# Patient Record
Sex: Female | Born: 1970 | Race: Black or African American | Hispanic: No | Smoking: Current some day smoker
Health system: Southern US, Community
[De-identification: ages and names within clinical notes are randomized; demographics above are authoritative.]

---

## 1999-02-23 ENCOUNTER — Encounter: Payer: Self-pay | Admitting: Emergency Medicine

## 1999-02-23 ENCOUNTER — Emergency Department (HOSPITAL_COMMUNITY): Admission: EM | Admit: 1999-02-23 | Discharge: 1999-02-23 | Payer: Self-pay | Admitting: Emergency Medicine

## 2001-07-30 ENCOUNTER — Emergency Department (HOSPITAL_COMMUNITY): Admission: EM | Admit: 2001-07-30 | Discharge: 2001-07-30 | Payer: Self-pay | Admitting: Emergency Medicine

## 2013-07-08 ENCOUNTER — Other Ambulatory Visit: Payer: Self-pay

## 2013-07-08 DIAGNOSIS — Z1231 Encounter for screening mammogram for malignant neoplasm of breast: Secondary | ICD-10-CM

## 2013-08-03 ENCOUNTER — Ambulatory Visit: Payer: Self-pay

## 2017-06-03 ENCOUNTER — Emergency Department (HOSPITAL_COMMUNITY): Payer: Self-pay

## 2017-06-03 ENCOUNTER — Emergency Department (HOSPITAL_COMMUNITY)
Admission: EM | Admit: 2017-06-03 | Discharge: 2017-06-03 | Disposition: A | Discharge status: Home / Self Care | Payer: Self-pay | Attending: Emergency Medicine | Admitting: Emergency Medicine

## 2017-06-03 ENCOUNTER — Encounter (HOSPITAL_COMMUNITY): Payer: Self-pay

## 2017-06-03 DIAGNOSIS — Z720 Tobacco use: Secondary | ICD-10-CM

## 2017-06-03 DIAGNOSIS — J209 Acute bronchitis, unspecified: Secondary | ICD-10-CM | POA: Insufficient documentation

## 2017-06-03 DIAGNOSIS — F17200 Nicotine dependence, unspecified, uncomplicated: Secondary | ICD-10-CM | POA: Insufficient documentation

## 2017-06-03 MED ORDER — AZITHROMYCIN 250 MG PO TABS
500.0000 mg | ORAL_TABLET | Freq: Once | ORAL | Status: AC
  Administered 2017-06-03: 500 mg via ORAL
  Filled 2017-06-03: qty 2

## 2017-06-03 MED ORDER — AEROCHAMBER PLUS FLO-VU LARGE MISC
Status: AC
  Filled 2017-06-03: qty 1

## 2017-06-03 MED ORDER — AZITHROMYCIN 250 MG PO TABS: ORAL_TABLET | ORAL | 0 refills | Status: AC

## 2017-06-03 MED ORDER — ALBUTEROL SULFATE HFA 108 (90 BASE) MCG/ACT IN AERS
1.0000 | INHALATION_SPRAY | RESPIRATORY_TRACT | Status: DC | PRN
  Administered 2017-06-03: 1 via RESPIRATORY_TRACT
  Filled 2017-06-03: qty 6.7

## 2017-06-03 MED ORDER — AEROCHAMBER Z-STAT PLUS/MEDIUM MISC
1.0000 | Freq: Once | Status: AC
  Administered 2017-06-03: 1

## 2017-06-03 NOTE — ED Notes (Signed)
Pt transported to xray 

## 2017-06-03 NOTE — Discharge Instructions (Signed)
Try to stop smoking. °

## 2017-06-03 NOTE — ED Provider Notes (Signed)
[facility]-EMERGENCY DEPT Provider Note   CSN: [id number] Arrival date & time: 06/03/17  0713     History   Chief Complaint Chief Complaint  Patient presents with  . cough, congestion    HPI Ellen Rhodes is a 46 y.o. female.  Pt presents to the ED today with 2 weeks of cough and congestion.  She has taken otc mucinex and sx have not improved.  She has had f/c.  She denies any n/v.  She does smoke.  No hx of lung problems.      History reviewed. No pertinent past medical history.  There are no active problems to display for this patient.   History reviewed. No pertinent surgical history.  OB History    No data available       Home Medications    Prior to Admission medications   Medication Sig Start Date End Date Taking? Authorizing Provider  azithromycin (ZITHROMAX) 250 MG tablet Take 1 every day until finished. 06/04/17   Jacalyn Lefevre, MD    Family History No family history on file.  Social History Social History  Substance Use Topics  . Smoking status: Current Some Day Smoker  . Smokeless tobacco: Never Used  . Alcohol use Not on file     Allergies   Patient has no known allergies.   Review of Systems Review of Systems  Respiratory: Positive for cough.   All other systems reviewed and are negative.    Physical Exam Updated Vital Signs BP (!) 140/93 (BP Location: Left Arm)   Pulse (!) 118   Temp 98.4 F (36.9 C) (Oral)   Resp 18   SpO2 98%   Physical Exam  Constitutional: She is oriented to person, place, and time. She appears well-developed and well-nourished.  HENT:  Head: Normocephalic and atraumatic.  Right Ear: External ear normal.  Left Ear: External ear normal.  Nose: Nose normal.  Mouth/Throat: Oropharynx is clear and moist.  Eyes: Pupils are equal, round, and reactive to light. Conjunctivae and EOM are normal.  Neck: Normal range of motion. Neck supple.  Cardiovascular: Normal rate, regular rhythm, normal heart sounds and  intact distal pulses.   Pulmonary/Chest: Effort normal. She has wheezes.  Abdominal: Soft. Bowel sounds are normal.  Musculoskeletal: Normal range of motion.  Neurological: She is alert and oriented to person, place, and time.  Skin: Skin is warm.  Psychiatric: She has a normal mood and affect. Her behavior is normal. Thought content normal.  Nursing note and vitals reviewed.    ED Treatments / Results  Labs (all labs ordered are listed, but only abnormal results are displayed) Labs Reviewed - No data to display  EKG  EKG Interpretation None       Radiology Dg Chest 2 View  Result Date: 06/03/2017 CLINICAL DATA:  Cough congestion chills EXAM: CHEST  2 VIEW COMPARISON:  None. FINDINGS: The heart size and mediastinal contours are within normal limits. Both lungs are clear. The visualized skeletal structures are unremarkable. IMPRESSION: No active cardiopulmonary disease. Electronically Signed   By: Marlan Palau M.D.   On: 06/03/2017 07:42    Procedures Procedures (including critical care time)  Medications Ordered in ED Medications  albuterol (PROVENTIL HFA;VENTOLIN HFA) 108 (90 Base) MCG/ACT inhaler 1-2 puff (1 puff Inhalation Given 06/03/17 0908)  AEROCHAMBER PLUS FLO-VU LARGE MISC (not administered)  azithromycin (ZITHROMAX) tablet 500 mg (500 mg Oral Given 06/03/17 0908)  aerochamber Z-Stat Plus/medium 1 each (1 each Other Given 06/03/17 0909)  Initial Impression / Assessment and Plan / ED Course  I have reviewed the triage vital signs and the nursing notes.  Pertinent labs & imaging results that were available during my care of the patient were reviewed by me and considered in my medical decision making (see chart for details).     Sx have been going on for 2 weeks, so I will treat with abx.  Pt also advised to stop smoking.  She knows to return if worse.  She is given the number for [facility] to establish pcp.  Final Clinical Impressions(s) / ED  Diagnoses   Final diagnoses:  Acute bronchitis, unspecified organism  Tobacco abuse    New Prescriptions New Prescriptions   AZITHROMYCIN (ZITHROMAX) 250 MG TABLET    Take 1 every day until finished.     Jacalyn LefevreHaviland, Roneshia Drew, MD 06/03/17 [phone]

## 2017-06-03 NOTE — ED Triage Notes (Signed)
Patient complains of 3 days of cough, congestion, chills and diaphoresis with body aches. Has taken otc cough meds. Alert and oriented, chest wall pain and headache with cough

## 2017-09-01 ENCOUNTER — Emergency Department (HOSPITAL_COMMUNITY): Payer: Self-pay

## 2017-09-01 ENCOUNTER — Encounter (HOSPITAL_COMMUNITY): Payer: Self-pay | Admitting: Emergency Medicine

## 2017-09-01 ENCOUNTER — Emergency Department (HOSPITAL_COMMUNITY)
Admission: EM | Admit: 2017-09-01 | Discharge: 2017-09-01 | Disposition: A | Discharge status: Home / Self Care | Payer: Self-pay | Attending: Emergency Medicine | Admitting: Emergency Medicine

## 2017-09-01 ENCOUNTER — Other Ambulatory Visit: Payer: Self-pay

## 2017-09-01 DIAGNOSIS — J069 Acute upper respiratory infection, unspecified: Secondary | ICD-10-CM | POA: Insufficient documentation

## 2017-09-01 DIAGNOSIS — F17210 Nicotine dependence, cigarettes, uncomplicated: Secondary | ICD-10-CM | POA: Insufficient documentation

## 2017-09-01 DIAGNOSIS — B9789 Other viral agents as the cause of diseases classified elsewhere: Secondary | ICD-10-CM

## 2017-09-01 DIAGNOSIS — R05 Cough: Secondary | ICD-10-CM

## 2017-09-01 DIAGNOSIS — R059 Cough, unspecified: Secondary | ICD-10-CM

## 2017-09-01 MED ORDER — DEXAMETHASONE SODIUM PHOSPHATE 10 MG/ML IJ SOLN
10.0000 mg | Freq: Once | INTRAMUSCULAR | Status: AC
  Administered 2017-09-01: 10 mg via INTRAMUSCULAR
  Filled 2017-09-01: qty 1

## 2017-09-01 MED ORDER — ALBUTEROL SULFATE HFA 108 (90 BASE) MCG/ACT IN AERS
2.0000 | INHALATION_SPRAY | Freq: Once | RESPIRATORY_TRACT | Status: AC
  Administered 2017-09-01: 2 via RESPIRATORY_TRACT
  Filled 2017-09-01: qty 6.7

## 2017-09-01 MED ORDER — IBUPROFEN 200 MG PO TABS
600.0000 mg | ORAL_TABLET | Freq: Once | ORAL | Status: AC
  Administered 2017-09-01: 12:00:00 600 mg via ORAL
  Filled 2017-09-01: qty 1

## 2017-09-01 NOTE — ED Triage Notes (Signed)
cold and chills and cough x `1 week states was sent home from work feels she has cold in her chest

## 2017-09-01 NOTE — ED Notes (Signed)
Pt stable, ambulatory, states understanding of discharge instructions 

## 2017-09-01 NOTE — ED Provider Notes (Signed)
[facility] EMERGENCY DEPARTMENT Provider Note   CSN: [id number] Arrival date & time: 09/01/17  1027     History   Chief Complaint Chief Complaint  Patient presents with  . Cough  . Influenza    HPI  Ellen Rhodes is a 46 y.o. Female with a history of tobacco use, presents complaining of 1 week of nasal congestion, cough and chills.  She reports symptoms started last Tuesday, when she started to get a lot of nasal congestion and drainage.  Since then she has had persistent drainage with a cough occasionally productive of mucus.  She reports some chills but denies measured fever at home.  Patient reports her throat feels a bit dry and scratchy, but is not necessarily sore.  Denies any ear pain, no chest pain or shortness of breath, no nausea, vomiting or abdominal pain.  Patient reports she has been trying to treat symptoms at home with Mucinex, TheraFlu and NyQuil, symptoms are worsening but she seen no improvement.  Patient tried to go into work today but her work sentt her home.       History reviewed. No pertinent past medical history.  There are no active problems to display for this patient.   History reviewed. No pertinent surgical history.  OB History    No data available       Home Medications    Prior to Admission medications   Medication Sig Start Date End Date Taking? Authorizing Provider  azithromycin (ZITHROMAX) 250 MG tablet Take 1 every day until finished. 06/04/17   Jacalyn Lefevre, MD    Family History No family history on file.  Social History Social History   Tobacco Use  . Smoking status: Current Some Day Smoker  . Smokeless tobacco: Never Used  Substance Use Topics  . Alcohol use: No    Frequency: Never  . Drug use: No     Allergies   Patient has no known allergies.   Review of Systems Review of Systems  Constitutional: Positive for chills. Negative for fatigue.  HENT: Positive for congestion, postnasal  drip, rhinorrhea and sore throat. Negative for ear discharge, ear pain, sinus pressure, trouble swallowing and voice change.   Eyes: Negative for discharge.  Respiratory: Positive for cough. Negative for chest tightness and shortness of breath.   Cardiovascular: Negative for chest pain.  Gastrointestinal: Negative for abdominal pain, diarrhea and vomiting.  Musculoskeletal: Positive for myalgias.  Skin: Negative for rash.  Neurological: Negative for headaches.     Physical Exam Updated Vital Signs BP 126/89   Pulse 99   Temp 98.2 F (36.8 C) (Oral)   Resp 20   SpO2 99%   Physical Exam  Constitutional: She appears well-developed and well-nourished. No distress.  HENT:  Head: Normocephalic and atraumatic.  TMs clear with good landmarks, moderate nasal mucosa edema with clear rhinorrhea, posterior oropharynx clear and moist, with some erythema, no edema or exudates. Uvula midline, no trismus, no evidence of PTA  Eyes: Right eye exhibits no discharge. Left eye exhibits no discharge.  Neck: Normal range of motion. Neck supple.  Cardiovascular: Normal rate, regular rhythm, normal heart sounds and intact distal pulses.  Pulmonary/Chest: Effort normal and breath sounds normal. No stridor. No respiratory distress. She has no wheezes. She has no rales. She exhibits no tenderness.  Abdominal: Soft. Bowel sounds are normal. She exhibits no distension. There is no tenderness. There is no guarding.  Lymphadenopathy:    She has no cervical adenopathy.  Neurological: She is alert. Coordination normal.  Skin: Skin is warm and dry. Capillary refill takes less than 2 seconds. She is not diaphoretic.  Psychiatric: She has a normal mood and affect. Her behavior is normal.  Nursing note and vitals reviewed.    ED Treatments / Results  Labs (all labs ordered are listed, but only abnormal results are displayed) Labs Reviewed - No data to display  EKG  EKG Interpretation None        Radiology Dg Chest 2 View  Result Date: 09/01/2017 CLINICAL DATA:  Sinus drainage and body aches for almost a week. EXAM: CHEST  2 VIEW COMPARISON:  PA and lateral chest 06/03/2017. FINDINGS: Lungs are clear. Heart size is normal. No pneumothorax or pleural effusion. No bony abnormality. IMPRESSION: Negative chest. Electronically Signed   By: Drusilla Kannerhomas  Dalessio M.D.   On: 09/01/2017 13:15    Procedures Procedures (including critical care time)  Medications Ordered in ED Medications  ibuprofen (ADVIL,MOTRIN) tablet 600 mg (600 mg Oral Given 09/01/17 1219)  dexamethasone (DECADRON) injection 10 mg (10 mg Intramuscular Given 09/01/17 1349)  albuterol (PROVENTIL HFA;VENTOLIN HFA) 108 (90 Base) MCG/ACT inhaler 2 puff (2 puffs Inhalation Given 09/01/17 1349)     Initial Impression / Assessment and Plan / ED Course  I have reviewed the triage vital signs and the nursing notes.  Pertinent labs & imaging results that were available during my care of the patient were reviewed by me and considered in my medical decision making (see chart for details).  Pt CXR negative for acute infiltrate. Patients symptoms are consistent with URI, likely viral etiology. Discussed that antibiotics are not indicated for viral infections. Given symptoms have been presents for 8 days with no improvement will give steroid and try albuterol inhaler for cough. Pt will be discharged with symptomatic treatment.  Verbalizes understanding and is agreeable with plan. Pt is hemodynamically stable & in NAD prior to dc.   Final Clinical Impressions(s) / ED Diagnoses   Final diagnoses:  Cough  Viral URI with cough    ED Discharge Orders    None       Legrand RamsFord, Clydell Sposito N, PA-C 09/01/17 2119    Raeford RazorKohut, Stephen, MD 09/09/17 1130

## 2017-09-01 NOTE — ED Notes (Signed)
Patient returned from xray.

## 2017-09-01 NOTE — Discharge Instructions (Signed)
Your symptoms are likely due to a viral upper respiratory infection, your chest x-ray is clear.  Treat symptoms supportively with Sudafed and Flonase for nasal decongestion.  You may also use albuterol inhaler as needed for cough, as well as over-the-counter cough syrups.  Ibuprofen or Tylenol for pain.  If symptoms are not improving please follow-up with the [facility] in 1 week.  If you develop shortness of breath, chest pain, persistent fevers or other concerning symptoms please follow-up sooner at the emergency department.

## 2021-09-25 ENCOUNTER — Encounter (HOSPITAL_COMMUNITY): Payer: Self-pay

## 2021-09-25 ENCOUNTER — Other Ambulatory Visit: Payer: Self-pay

## 2021-09-25 ENCOUNTER — Emergency Department (HOSPITAL_COMMUNITY): Payer: Self-pay

## 2021-09-25 ENCOUNTER — Emergency Department (HOSPITAL_COMMUNITY)
Admission: EM | Admit: 2021-09-25 | Discharge: 2021-09-25 | Disposition: A | Discharge status: Home / Self Care | Payer: Self-pay | Attending: Emergency Medicine | Admitting: Emergency Medicine

## 2021-09-25 DIAGNOSIS — Z5321 Procedure and treatment not carried out due to patient leaving prior to being seen by health care provider: Secondary | ICD-10-CM | POA: Insufficient documentation

## 2021-09-25 DIAGNOSIS — R42 Dizziness and giddiness: Secondary | ICD-10-CM | POA: Insufficient documentation

## 2021-09-25 DIAGNOSIS — R079 Chest pain, unspecified: Secondary | ICD-10-CM | POA: Insufficient documentation

## 2021-09-25 DIAGNOSIS — R110 Nausea: Secondary | ICD-10-CM | POA: Insufficient documentation

## 2021-09-25 LAB — CBC
HCT: 43 % (ref 36.0–46.0)
Hemoglobin: 13.9 g/dL (ref 12.0–15.0)
MCH: 26.4 pg (ref 26.0–34.0)
MCHC: 32.3 g/dL (ref 30.0–36.0)
MCV: 81.7 fL (ref 80.0–100.0)
Platelets: 412 10*3/uL — ABNORMAL HIGH (ref 150–400)
RBC: 5.26 MIL/uL — ABNORMAL HIGH (ref 3.87–5.11)
RDW: 14.9 % (ref 11.5–15.5)
WBC: 10.6 10*3/uL — ABNORMAL HIGH (ref 4.0–10.5)
nRBC: 0 % (ref 0.0–0.2)

## 2021-09-25 LAB — BASIC METABOLIC PANEL
Anion gap: 9 (ref 5–15)
BUN: 10 mg/dL (ref 6–20)
CO2: 23 mmol/L (ref 22–32)
Calcium: 8.9 mg/dL (ref 8.9–10.3)
Chloride: 106 mmol/L (ref 98–111)
Creatinine, Ser: 0.6 mg/dL (ref 0.44–1.00)
GFR, Estimated: >60 mL/min (ref >60)
Glucose, Bld: 85 mg/dL (ref 70–99)
Potassium: 4.1 mmol/L (ref 3.5–5.1)
Sodium: 138 mmol/L (ref 135–145)

## 2021-09-25 LAB — TROPONIN I (HIGH SENSITIVITY): Troponin I (High Sensitivity): 2 ng/L (ref <18)

## 2021-09-25 NOTE — ED Triage Notes (Signed)
Patient reports that she breathed in some carbon monoxide at work. X 30 minutes and since breathing the carbon monoxide  she reports nausea and chest pain x 3 days

## 2021-09-25 NOTE — ED Notes (Signed)
Pt says she's leaving.

## 2021-09-25 NOTE — ED Provider Notes (Signed)
Emergency Medicine Provider Triage Evaluation Note  Ellen Rhodes , a 50 y.o. female  was evaluated in triage.  Pt complains of nausea, dizziness, chest pain.  Started on Friday after her furnace was placed at her work and she had 30 minutes of carbon oxide exposure..  Review of Systems  Positive: Chest pain Negative: syncope  Physical Exam  BP (!) 136/94 (BP Location: Left Arm)   Pulse 94   Temp 98 F (36.7 C) (Oral)   Resp 18   Ht 5\' 1"  (1.549 m)   Wt 79.8 kg   SpO2 100%   BMI 33.25 kg/m  Gen:   Awake, no distress   Resp:  Normal effort  MSK:   Moves extremities without difficulty  Other:  Elevated heart rate but not tachycardic, no tachypnea.  Medical Decision Making  Medically screening exam initiated at 1:32 PM.  Appropriate orders placed.  Ellen Rhodes was informed that the remainder of the evaluation will be completed by another provider, this initial triage assessment does not replace that evaluation, and the importance of remaining in the ED until their evaluation is complete.  Doubt monoxide poisoning given his been more than 3 days on room air.  Chest pain work-up   Ellen Rhodes, Ellen Rhodes 09/25/21 1333    09/27/21, MD 09/30/21 1459

## 2021-09-26 ENCOUNTER — Other Ambulatory Visit: Payer: Self-pay

## 2021-09-26 ENCOUNTER — Encounter (HOSPITAL_COMMUNITY): Payer: Self-pay

## 2021-09-26 ENCOUNTER — Emergency Department (HOSPITAL_COMMUNITY)
Admission: EM | Admit: 2021-09-26 | Discharge: 2021-09-26 | Discharge status: Against Medical Advice | Payer: Medicaid Other | Attending: Emergency Medicine | Admitting: Emergency Medicine

## 2021-09-26 DIAGNOSIS — R0789 Other chest pain: Secondary | ICD-10-CM | POA: Insufficient documentation

## 2021-09-26 DIAGNOSIS — R0602 Shortness of breath: Secondary | ICD-10-CM | POA: Insufficient documentation

## 2021-09-26 DIAGNOSIS — Z20822 Contact with and (suspected) exposure to covid-19: Secondary | ICD-10-CM | POA: Insufficient documentation

## 2021-09-26 DIAGNOSIS — F17210 Nicotine dependence, cigarettes, uncomplicated: Secondary | ICD-10-CM | POA: Insufficient documentation

## 2021-09-26 LAB — RESP PANEL BY RT-PCR (FLU A&B, COVID) ARPGX2
Influenza A by PCR: NEGATIVE
Influenza B by PCR: NEGATIVE
SARS Coronavirus 2 by RT PCR: NEGATIVE

## 2021-09-26 MED ORDER — IBUPROFEN 800 MG PO TABS
800.0000 mg | ORAL_TABLET | Freq: Once | ORAL | Status: AC
  Administered 2021-09-26: 800 mg via ORAL
  Filled 2021-09-26: qty 1

## 2021-09-26 NOTE — ED Provider Notes (Signed)
[facility]   CSN: [id number] Arrival date & time: 09/26/21  3785     History Chief Complaint  Patient presents with   Chest Pain   Shortness of Breath    Ellen Rhodes is a 50 y.o. female.  Patient complains of some chest pain and shortness of breath.  She was seen in ED yesterday briefly but left AMA.  Patient having some chest discomfort now labs yesterday including troponins were negative.  EKG and chest x-ray are unremarkable  The history is provided by the patient and medical records. No language interpreter was used.  Chest Pain Pain location:  Unable to specify Pain quality: aching   Pain radiates to:  Does not radiate Pain severity:  Mild Onset quality:  Gradual Timing:  Constant Progression:  Waxing and waning Chronicity:  New Associated symptoms: shortness of breath   Associated symptoms: no abdominal pain, no back pain, no cough, no fatigue and no headache   Shortness of Breath Associated symptoms: chest pain   Associated symptoms: no abdominal pain, no cough, no headaches and no rash       History reviewed. No pertinent past medical history.  There are no problems to display for this patient.   History reviewed. No pertinent surgical history.   OB History   No obstetric history on file.     Family History  Problem Relation Age of Onset   Stroke Mother    Cerebral aneurysm Mother     Social History   Tobacco Use   Smoking status: Some Days    Types: Cigarettes   Smokeless tobacco: Never  Vaping Use   Vaping Use: Never used  Substance Use Topics   Alcohol use: No   Drug use: No    Home Medications Prior to Admission medications   Medication Sig Start Date End Date Taking? Authorizing Provider  azithromycin (ZITHROMAX) 250 MG tablet Take 1 every day until finished. 06/04/17   Jacalyn Lefevre, MD    Allergies    Patient has no known allergies.  Review of Systems   Review of  Systems  Constitutional:  Negative for appetite change and fatigue.  HENT:  Negative for congestion, ear discharge and sinus pressure.   Eyes:  Negative for discharge.  Respiratory:  Positive for shortness of breath. Negative for cough.   Cardiovascular:  Positive for chest pain.  Gastrointestinal:  Negative for abdominal pain and diarrhea.  Genitourinary:  Negative for frequency and hematuria.  Musculoskeletal:  Negative for back pain.  Skin:  Negative for rash.  Neurological:  Negative for seizures and headaches.  Psychiatric/Behavioral:  Negative for hallucinations.    Physical Exam Updated Vital Signs BP (!) 130/115   Pulse 72   Temp 98.7 F (37.1 C) (Oral)   Resp 20   Ht 5\' 1"  (1.549 m)   Wt 79.8 kg   SpO2 100%   BMI 33.25 kg/m   Physical Exam Vitals and nursing note reviewed.  Constitutional:      Appearance: She is well-developed.  HENT:     Head: Normocephalic.     Nose: Nose normal.  Eyes:     General: No scleral icterus.    Conjunctiva/sclera: Conjunctivae normal.  Neck:     Thyroid: No thyromegaly.  Cardiovascular:     Rate and Rhythm: Normal rate and regular rhythm.     Heart sounds: No murmur heard.   No friction rub. No gallop.  Pulmonary:     Breath  sounds: No stridor. No wheezing or rales.  Chest:     Chest wall: No tenderness.  Abdominal:     General: There is no distension.     Tenderness: There is no abdominal tenderness. There is no rebound.  Musculoskeletal:        General: Normal range of motion.     Cervical back: Neck supple.  Lymphadenopathy:     Cervical: No cervical adenopathy.  Skin:    Findings: No erythema or rash.  Neurological:     Mental Status: She is alert and oriented to person, place, and time.     Motor: No abnormal muscle tone.     Coordination: Coordination normal.  Psychiatric:        Behavior: Behavior normal.    ED Results / Procedures / Treatments   Labs (all labs ordered are listed, but only abnormal  results are displayed) Labs Reviewed  RESP PANEL BY RT-PCR (FLU A&B, COVID) [id number]    EKG None  Radiology DG Chest 2 View  Result Date: 09/25/2021 CLINICAL DATA:  Chest pain EXAM: CHEST - 2 VIEW COMPARISON:  November 2018 FINDINGS: The heart size and mediastinal contours are within normal limits. Both lungs are clear. No pleural effusion or pneumothorax. The visualized skeletal structures are unremarkable. IMPRESSION: No acute process in the chest. Electronically Signed   By: Macy Mis M.D.   On: 09/25/2021 14:26    Procedures Procedures   Medications Ordered in ED Medications  ibuprofen (ADVIL) tablet 800 mg (800 mg Oral Given 09/26/21 0935)    ED Course  I have reviewed the triage vital signs and the nursing notes.  Pertinent labs & imaging results that were available during my care of the patient were reviewed by me and considered in my medical decision making (see chart for details).    MDM Rules/Calculators/A&P                           Patient with atypical chest discomfort and had normal labs and EKG.  Patient left AMA before we will discuss the treatment plan Final Clinical Impression(s) / ED Diagnoses Final diagnoses:  Atypical chest pain    Rx / DC Orders ED Discharge Orders     None        Milton Ferguson, MD 09/26/21 1227

## 2021-09-26 NOTE — ED Triage Notes (Signed)
Pt reports being seen here for same yesterday, but left prior to being seen by a provider and getting results. She reports her nausea has subsided, but occasionally still has mild chest pain and shortness of breath.

## 2021-09-27 ENCOUNTER — Other Ambulatory Visit: Payer: Self-pay

## 2021-09-27 ENCOUNTER — Emergency Department (HOSPITAL_COMMUNITY): Payer: Medicaid Other

## 2021-09-27 ENCOUNTER — Encounter (HOSPITAL_COMMUNITY): Payer: Self-pay | Admitting: Emergency Medicine

## 2021-09-27 ENCOUNTER — Emergency Department (HOSPITAL_COMMUNITY)
Admission: EM | Admit: 2021-09-27 | Discharge: 2021-09-27 | Disposition: A | Discharge status: Home / Self Care | Payer: Medicaid Other | Attending: Emergency Medicine | Admitting: Emergency Medicine

## 2021-09-27 DIAGNOSIS — R42 Dizziness and giddiness: Secondary | ICD-10-CM | POA: Insufficient documentation

## 2021-09-27 DIAGNOSIS — F17210 Nicotine dependence, cigarettes, uncomplicated: Secondary | ICD-10-CM | POA: Insufficient documentation

## 2021-09-27 DIAGNOSIS — J3489 Other specified disorders of nose and nasal sinuses: Secondary | ICD-10-CM | POA: Insufficient documentation

## 2021-09-27 DIAGNOSIS — J069 Acute upper respiratory infection, unspecified: Secondary | ICD-10-CM | POA: Insufficient documentation

## 2021-09-27 LAB — COMPREHENSIVE METABOLIC PANEL
ALT: 15 U/L (ref 0–44)
AST: 15 U/L (ref 15–41)
Albumin: 3.3 g/dL — ABNORMAL LOW (ref 3.5–5.0)
Alkaline Phosphatase: 77 U/L (ref 38–126)
Anion gap: 7 (ref 5–15)
BUN: 10 mg/dL (ref 6–20)
CO2: 27 mmol/L (ref 22–32)
Calcium: 8.9 mg/dL (ref 8.9–10.3)
Chloride: 105 mmol/L (ref 98–111)
Creatinine, Ser: 0.79 mg/dL (ref 0.44–1.00)
GFR, Estimated: >60 mL/min (ref >60)
Glucose, Bld: 95 mg/dL (ref 70–99)
Potassium: 3.9 mmol/L (ref 3.5–5.1)
Sodium: 139 mmol/L (ref 135–145)
Total Bilirubin: 0.5 mg/dL (ref 0.3–1.2)
Total Protein: 6.7 g/dL (ref 6.5–8.1)

## 2021-09-27 LAB — CBC WITH DIFFERENTIAL/PLATELET
Abs Immature Granulocytes: 0.07 10*3/uL (ref 0.00–0.07)
Basophils Absolute: 0 10*3/uL (ref 0.0–0.1)
Basophils Relative: 0 %
Eosinophils Absolute: 0.2 10*3/uL (ref 0.0–0.5)
Eosinophils Relative: 4 %
HCT: 41.4 % (ref 36.0–46.0)
Hemoglobin: 13 g/dL (ref 12.0–15.0)
Immature Granulocytes: 1 %
Lymphocytes Relative: 38 %
Lymphs Abs: 2.6 10*3/uL (ref 0.7–4.0)
MCH: 26 pg (ref 26.0–34.0)
MCHC: 31.4 g/dL (ref 30.0–36.0)
MCV: 82.8 fL (ref 80.0–100.0)
Monocytes Absolute: 0.5 10*3/uL (ref 0.1–1.0)
Monocytes Relative: 7 %
Neutro Abs: 3.5 10*3/uL (ref 1.7–7.7)
Neutrophils Relative %: 50 %
Platelets: 370 10*3/uL (ref 150–400)
RBC: 5 MIL/uL (ref 3.87–5.11)
RDW: 14.9 % (ref 11.5–15.5)
WBC: 6.9 10*3/uL (ref 4.0–10.5)
nRBC: 0 % (ref 0.0–0.2)

## 2021-09-27 LAB — URINALYSIS, ROUTINE W REFLEX MICROSCOPIC
Bilirubin Urine: NEGATIVE
Glucose, UA: NEGATIVE mg/dL
Hgb urine dipstick: NEGATIVE
Ketones, ur: NEGATIVE mg/dL
Leukocytes,Ua: NEGATIVE
Nitrite: NEGATIVE
Protein, ur: NEGATIVE mg/dL
Specific Gravity, Urine: >1.03 — ABNORMAL HIGH (ref 1.005–1.030)
pH: 5.5 (ref 5.0–8.0)

## 2021-09-27 MED ORDER — ALBUTEROL SULFATE HFA 108 (90 BASE) MCG/ACT IN AERS
2.0000 | INHALATION_SPRAY | Freq: Once | RESPIRATORY_TRACT | Status: AC
  Administered 2021-09-27: 2 via RESPIRATORY_TRACT
  Filled 2021-09-27: qty 6.7

## 2021-09-27 MED ORDER — DEXAMETHASONE 4 MG PO TABS
10.0000 mg | ORAL_TABLET | Freq: Once | ORAL | Status: AC
  Administered 2021-09-27: 10 mg via ORAL
  Filled 2021-09-27: qty 3

## 2021-09-27 NOTE — ED Provider Notes (Signed)
Emergency Medicine Provider Triage Evaluation Note  Ellen Rhodes , a 50 y.o. female  was evaluated in triage.  Pt complains of dizziness. Seen in ED two days ago, left after MSE.  Seen yesterday again, left prior to plan discussion per previous provider note.  Has been having headache over the last few days.  No known traumatic injury.  No congestion, rhinorrhea.  Had chest pain yesterday which resolved.  No shortness of breath, lower extremity swelling.  No paresthesias, weakness.  She does feel dizzy which she cannot describe.  Has been ambulatory without difficulty.  Review of Systems  Positive: Headache, dizziness, chest pain Negative: Numbness, unilateral weakness  Physical Exam  BP 123/69 (BP Location: Right Arm)   Pulse 95   Temp 99 F (37.2 C) (Oral)   Resp 14   SpO2 100%  Gen:   Awake, no distress   Resp:  Normal effort  MSK:   Moves extremities without difficulty  Neuro:  Cn 2-12 grossly intact, intact sensation, equal strength Other:    Medical Decision Making  Medically screening exam initiated at 7:54 AM.  Appropriate orders placed.  Marvelous A Wisler was informed that the remainder of the evaluation will be completed by another provider, this initial triage assessment does not replace that evaluation, and the importance of remaining in the ED until their evaluation is complete.  Headache, dizziness, chest pain.  Third visit within 2 days for similar complaints, will obtain head imaging, labs   Kashif Pooler A, PA-C 09/27/21 0806    Terald Sleeper, MD 09/27/21 [phone]

## 2021-09-27 NOTE — Discharge Instructions (Signed)
Take tylenol 2 pills 4 times a day and motrin 4 pills 3 times a day.  Drink plenty of fluids.  Return for worsening shortness of breath, headache, confusion. Follow up with your family doctor.   

## 2021-09-27 NOTE — ED Notes (Signed)
Pt verbalized understanding of d/c instructions, meds, and followup care. Denies questions. VSS, no distress noted. Steady gait to exit with all belongings.  ?

## 2021-09-27 NOTE — ED Provider Notes (Signed)
[facility] EMERGENCY DEPARTMENT Provider Note   CSN: [id number] Arrival date & time: 09/27/21  [id number]     History Chief Complaint  Patient presents with   Dizziness    Ellen Rhodes is a 50 y.o. female.  50 yo F with chief complaints of cough congestion shortness of breath dizziness going on for a couple days.  The patient was at work and they had installed a new fireplace and had tried to use it and slid off the smell that she found to be somewhat irritating.  She was fine while she was at work but when she got home that evening started having some difficulty breathing.  Had been to the ED now 3 times since but had not been able to wait to be seen.  She feels like she has gotten progressively better.  Has had some dizziness and nausea with this.  A little bit of cough and congestion.  Had some chest discomfort as well that seems to be off and on.  The history is provided by the patient.  Dizziness Associated symptoms: headaches   Associated symptoms: no chest pain, no nausea, no palpitations, no shortness of breath and no vomiting   Illness Severity:  Moderate Onset quality:  Gradual Duration:  3 hours Timing:  Constant Progression:  Worsening Chronicity:  New Associated symptoms: congestion, cough, headaches and rhinorrhea   Associated symptoms: no chest pain, no fever, no myalgias, no nausea, no shortness of breath, no vomiting and no wheezing       History reviewed. No pertinent past medical history.  There are no problems to display for this patient.   History reviewed. No pertinent surgical history.   OB History   No obstetric history on file.     Family History  Problem Relation Age of Onset   Stroke Mother    Cerebral aneurysm Mother     Social History   Tobacco Use   Smoking status: Some Days    Types: Cigarettes   Smokeless tobacco: Never  Vaping Use   Vaping Use: Never used  Substance Use Topics   Alcohol use: No   Drug  use: No    Home Medications Prior to Admission medications   Medication Sig Start Date End Date Taking? Authorizing Provider  azithromycin (ZITHROMAX) 250 MG tablet Take 1 every day until finished. 06/04/17   Isla Pence, MD    Allergies    Patient has no known allergies.  Review of Systems   Review of Systems  Constitutional:  Negative for chills and fever.  HENT:  Positive for congestion, postnasal drip and rhinorrhea.   Eyes:  Negative for redness and visual disturbance.  Respiratory:  Positive for cough. Negative for shortness of breath and wheezing.   Cardiovascular:  Negative for chest pain and palpitations.  Gastrointestinal:  Negative for nausea and vomiting.  Genitourinary:  Negative for dysuria and urgency.  Musculoskeletal:  Negative for arthralgias and myalgias.  Skin:  Negative for pallor and wound.  Neurological:  Positive for dizziness and headaches.   Physical Exam Updated Vital Signs BP 113/83   Pulse 78   Temp 97.8 F (36.6 C) (Oral)   Resp 16   SpO2 98%   Physical Exam Vitals and nursing note reviewed.  Constitutional:      General: She is not in acute distress.    Appearance: She is well-developed. She is not diaphoretic.  HENT:     Head: Normocephalic and atraumatic.  Comments: Swollen turbinates, posterior nasal drip, no noted sinus ttp, tm normal bilaterally.   Eyes:     Pupils: Pupils are equal, round, and reactive to light.  Cardiovascular:     Rate and Rhythm: Normal rate and regular rhythm.     Heart sounds: No murmur heard.   No friction rub. No gallop.  Pulmonary:     Effort: Pulmonary effort is normal.     Breath sounds: No wheezing or rales.  Abdominal:     General: There is no distension.     Palpations: Abdomen is soft.     Tenderness: There is no abdominal tenderness.  Musculoskeletal:        General: No tenderness.     Cervical back: Normal range of motion and neck supple.  Skin:    General: Skin is warm and dry.   Neurological:     Mental Status: She is alert and oriented to person, place, and time.     GCS: GCS eye subscore is 4. GCS verbal subscore is 5. GCS motor subscore is 6.     Cranial Nerves: Cranial nerves 2-12 are intact.     Sensory: Sensation is intact.     Motor: Motor function is intact.     Coordination: Coordination is intact.     Gait: Gait is intact.     Comments: Benign neurologic exam.  Ambulates without issue.  Psychiatric:        Behavior: Behavior normal.    ED Results / Procedures / Treatments   Labs (all labs ordered are listed, but only abnormal results are displayed) Labs Reviewed  COMPREHENSIVE METABOLIC PANEL - Abnormal; Notable for the following components:      Result Value   Albumin 3.3 (*)    All other components within normal limits  URINALYSIS, ROUTINE W REFLEX MICROSCOPIC - Abnormal; Notable for the following components:   Specific Gravity, Urine >1.030 (*)    All other components within normal limits  RESP PANEL BY RT-PCR (FLU A&B, COVID) [id number]  CBC WITH DIFFERENTIAL/PLATELET    EKG EKG Interpretation  Date/Time:  Thursday September 27 2021 07:45:36 EST Ventricular Rate:  100 PR Interval:  132 QRS Duration: 78 QT Interval:  342 QTC Calculation: 441 R Axis:   32 Text Interpretation: Normal sinus rhythm Normal ECG No significant change since last tracing Confirmed by Deno Etienne [phone]) on 09/27/2021 1:12:41 PM  Radiology DG Chest 2 View  Result Date: 09/25/2021 CLINICAL DATA:  Chest pain EXAM: CHEST - 2 VIEW COMPARISON:  November 2018 FINDINGS: The heart size and mediastinal contours are within normal limits. Both lungs are clear. No pleural effusion or pneumothorax. The visualized skeletal structures are unremarkable. IMPRESSION: No acute process in the chest. Electronically Signed   By: Macy Mis M.D.   On: 09/25/2021 14:26   CT HEAD WO CONTRAST (5MM)  Result Date: 09/27/2021 CLINICAL DATA:  Headache. EXAM: CT HEAD WITHOUT CONTRAST  TECHNIQUE: Contiguous axial images were obtained from the base of the skull through the vertex without intravenous contrast. COMPARISON:  No comparison studies available. FINDINGS: Brain: There is no evidence for acute hemorrhage, hydrocephalus, mass lesion, or abnormal extra-axial fluid collection. No definite CT evidence for acute infarction. Vascular: No hyperdense vessel or unexpected calcification. Skull: No evidence for fracture. No worrisome lytic or sclerotic lesion. Sinuses/Orbits: The visualized paranasal sinuses and mastoid air cells are clear. Visualized portions of the globes and intraorbital fat are unremarkable. Other: None. IMPRESSION: No acute intracranial abnormality. Electronically Signed  By: Kennith Center M.D.   On: 09/27/2021 08:22    Procedures Procedures   Medications Ordered in ED Medications  albuterol (VENTOLIN HFA) 108 (90 Base) MCG/ACT inhaler 2 puff (2 puffs Inhalation Given 09/27/21 1333)  dexamethasone (DECADRON) tablet 10 mg (10 mg Oral Given 09/27/21 1333)    ED Course  I have reviewed the triage vital signs and the nursing notes.  Pertinent labs & imaging results that were available during my care of the patient were reviewed by me and considered in my medical decision making (see chart for details).    MDM Rules/Calculators/A&P                           50 yo F with a chief complaints of what sounds like a upper respiratory illness.  Going on for about 3 days.  She has been seen on multiple times in the ED but has not stayed to be fully evaluated by a provider.  Has had multiple troponins that are negative blood work without significant anemia or electrolyte abnormality.  COVID and flu testing negative.  Chest x-ray viewed by me without focal infiltrate.  She had a CT scan of the head done today through the [facility] process that is also negative.  The other possible cause of her symptoms despite viral would be a irritation that triggered her underlying lung disease  from chemical exposure a few days ago.  She does typically have an inhaler at home but ran out a few days ago.  We will give her 2 puffs of albuterol here and let her take the inhaler home.  One-time dose of Decadron.  PCP follow-up.  1:38 PM:  I have discussed the diagnosis/risks/treatment options with the patient and believe the pt to be eligible for discharge home to follow-up with PCP. We also discussed returning to the ED immediately if new or worsening sx occur. We discussed the sx which are most concerning (e.g., sudden worsening pain, fever, inability to tolerate by mouth) that necessitate immediate return. Medications administered to the patient during their visit and any new prescriptions provided to the patient are listed below.  Medications given during this visit Medications  albuterol (VENTOLIN HFA) 108 (90 Base) MCG/ACT inhaler 2 puff (2 puffs Inhalation Given 09/27/21 1333)  dexamethasone (DECADRON) tablet 10 mg (10 mg Oral Given 09/27/21 1333)     The patient appears reasonably screen and/or stabilized for discharge and I doubt any other medical condition or other [facility] requiring further screening, evaluation, or treatment in the ED at this time prior to discharge.   Final Clinical Impression(s) / ED Diagnoses Final diagnoses:  Upper respiratory tract infection, unspecified type    Rx / DC Orders ED Discharge Orders     None        Melene Plan, DO 09/27/21 1338

## 2021-09-27 NOTE — ED Triage Notes (Signed)
Patient here with intermittent headache and dizziness since 09/21/2021. Patient states she had a new gas fireplace installed a few days before Thanksgiving and states when it was first lit there were a lot of fumes in the room that patient believes that may be the cause for symptoms. Patient alert, oriented, and in no apparent distress at this time.

## 2022-10-08 ENCOUNTER — Encounter: Payer: Medicaid Other | Admitting: Family

## 2022-10-13 NOTE — Progress Notes (Signed)
  This encounter was created in error - please disregard. No show 

## 2022-12-03 IMAGING — CT CT HEAD W/O CM
3 series · 15 of 46 positions shown, 18 images · non-contrast
Comparison: No comparison studies available.

CLINICAL DATA: Headache.

EXAM:
CT HEAD WITHOUT CONTRAST
TECHNIQUE: Contiguous axial images were obtained from the base of the skull
through the vertex without intravenous contrast.

[Series 3: head 5.0 h30s · axial · 0.41mm/px · z∈[-40,+80]mm · 9 of 29 slices shown, 12 images]
[im 3/29  brain]
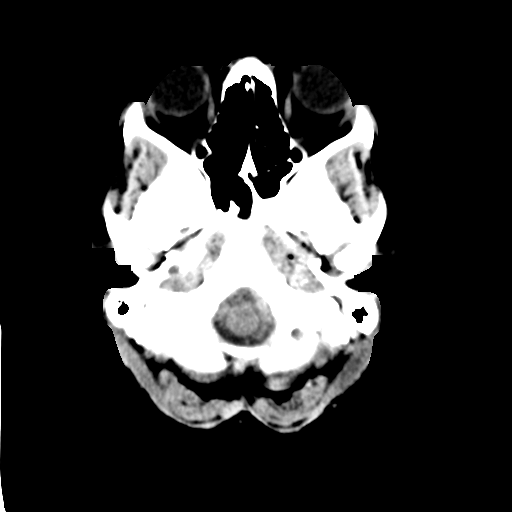
[im 3/29  bone]
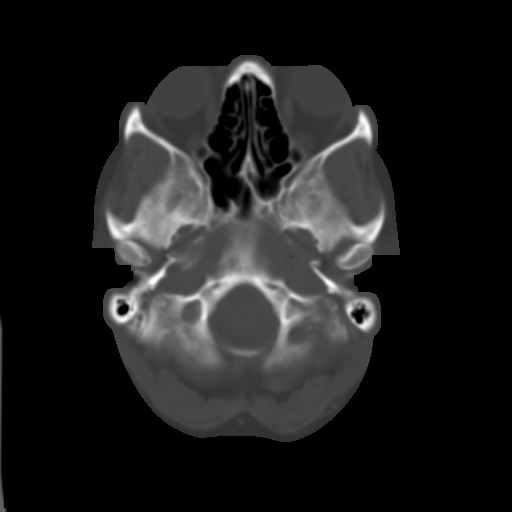
[im 6/29  brain]
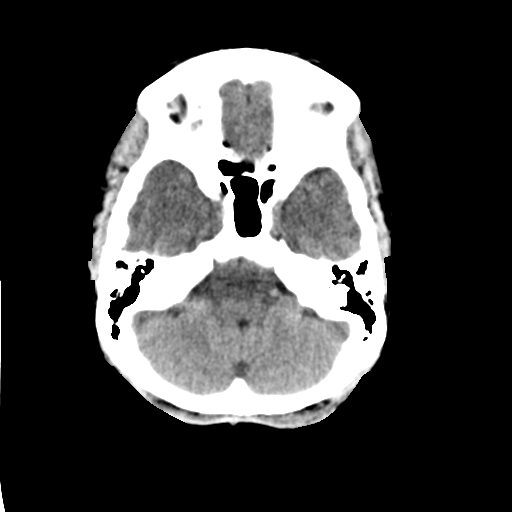
[im 9/29  brain]
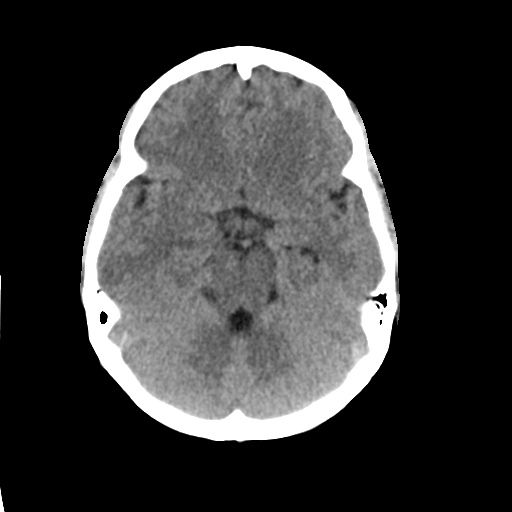
[im 12/29  brain]
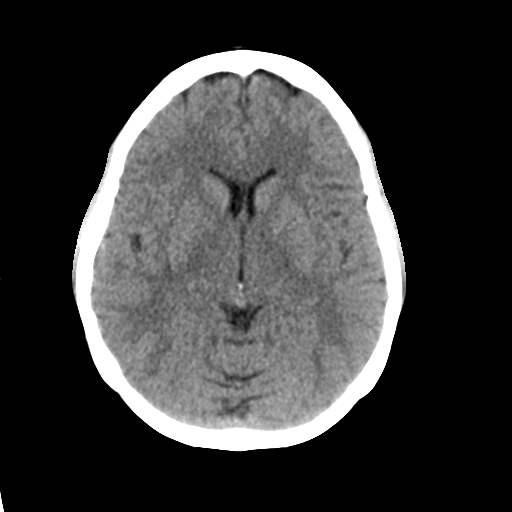
[im 15/29  brain]
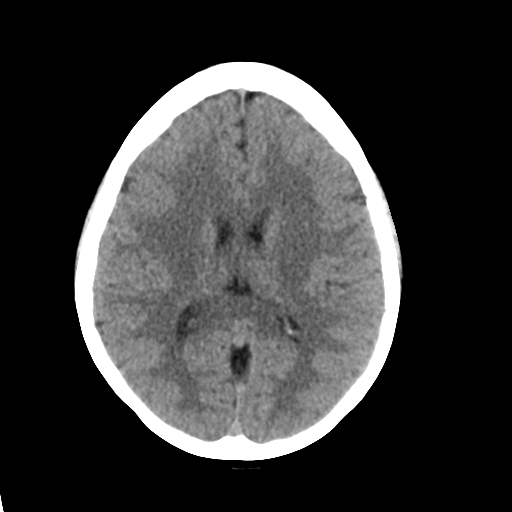
[im 15/29  bone]
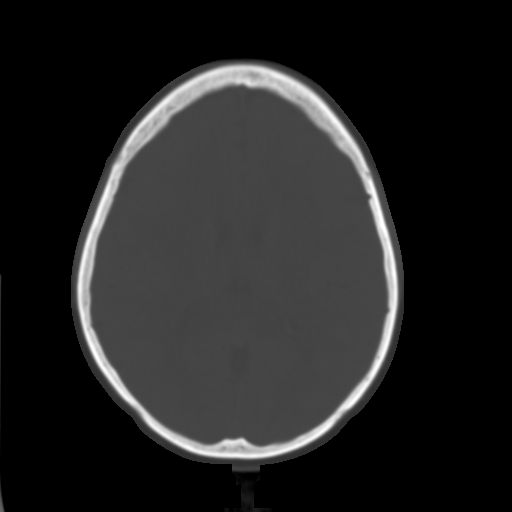
[im 18/29  brain]
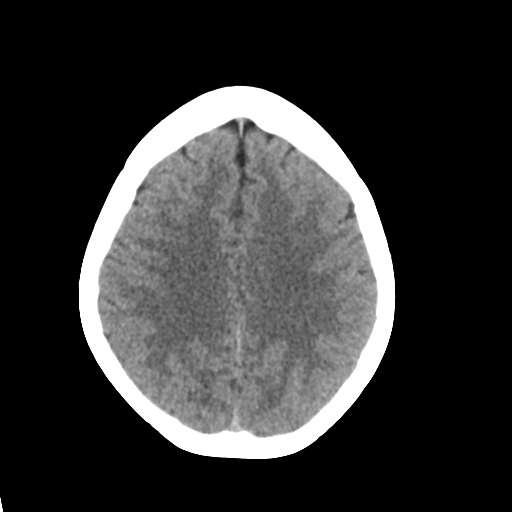
[im 21/29  brain]
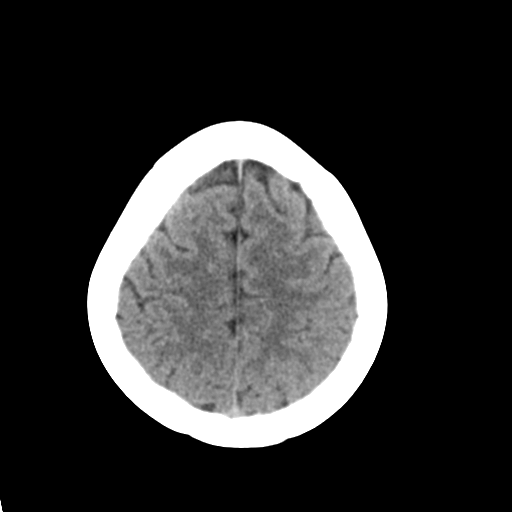
[im 24/29  brain]
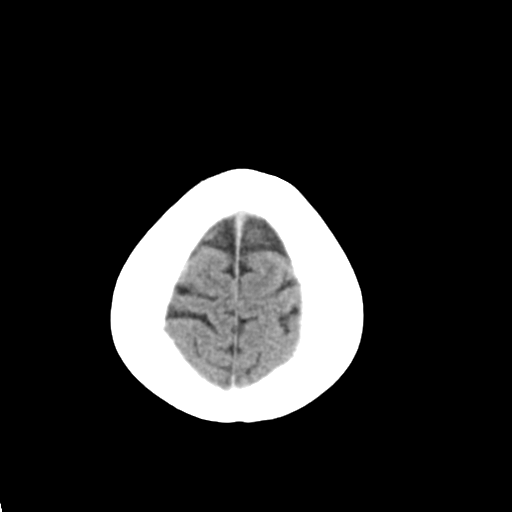
[im 27/29  brain]
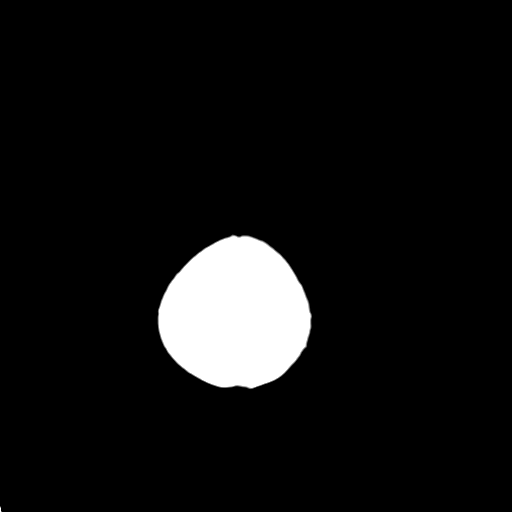
[im 27/29  bone]
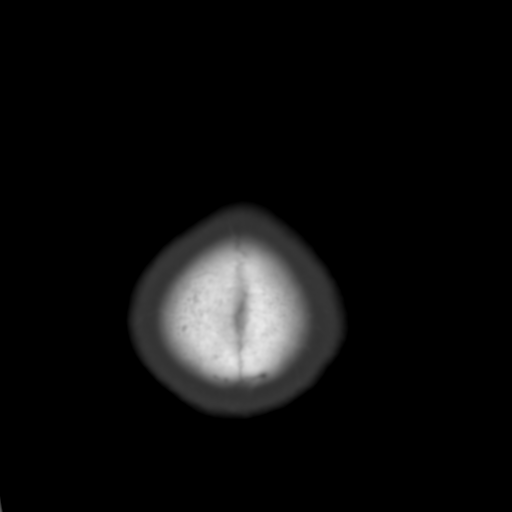

[Series 5: head 3.0 mpr cor · coronal · 0.29mm/px · 3 of 64 slices shown]
[im 22/64  brain]
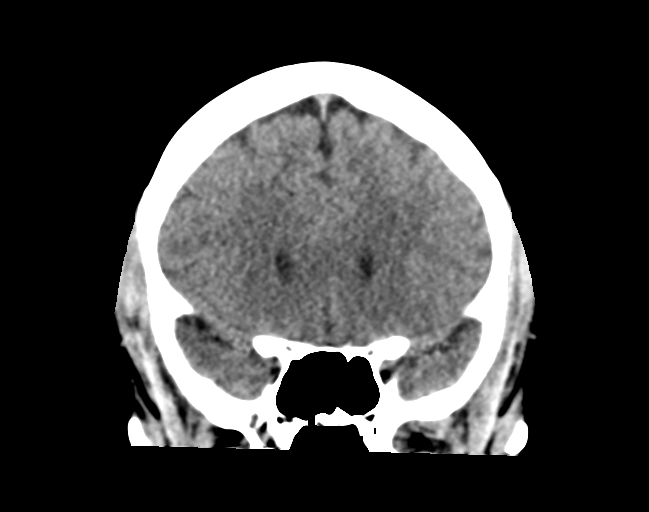
[im 29/64  brain]
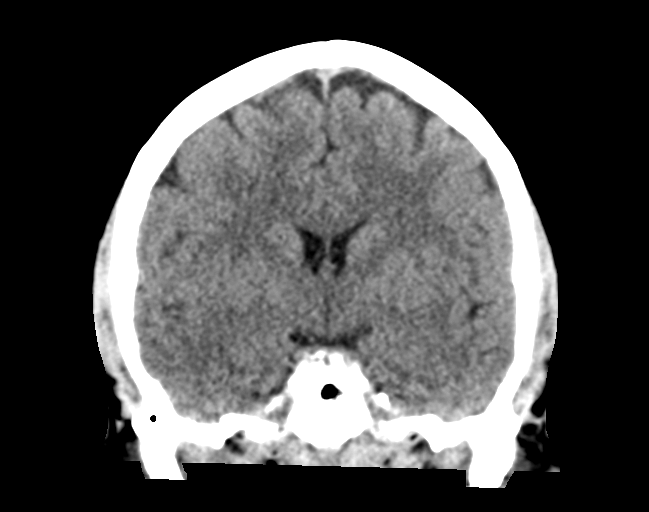
[im 36/64  brain]
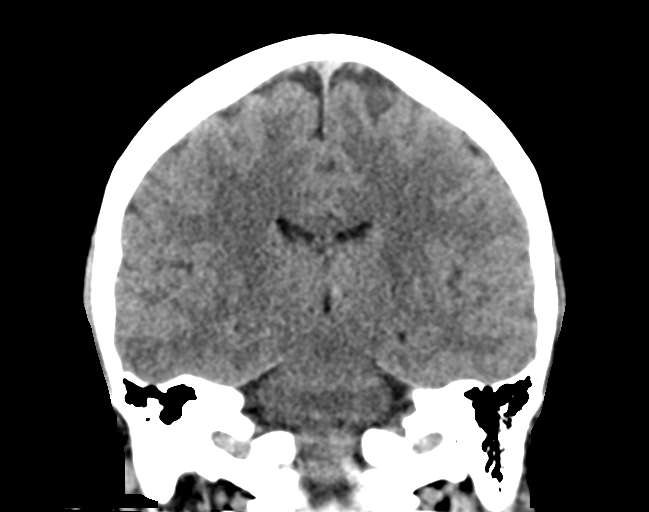

[Series 6: head 3.0 mpr sag · sagittal · 0.29mm/px · 3 of 59 slices shown]
[im 20/59  brain]
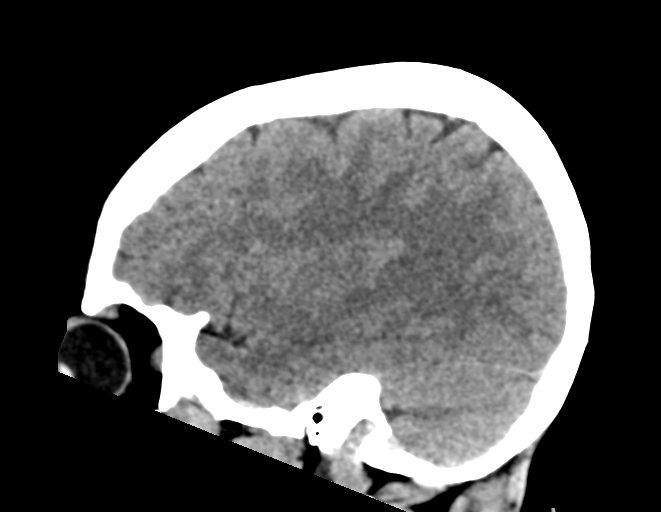
[im 30/59  brain]
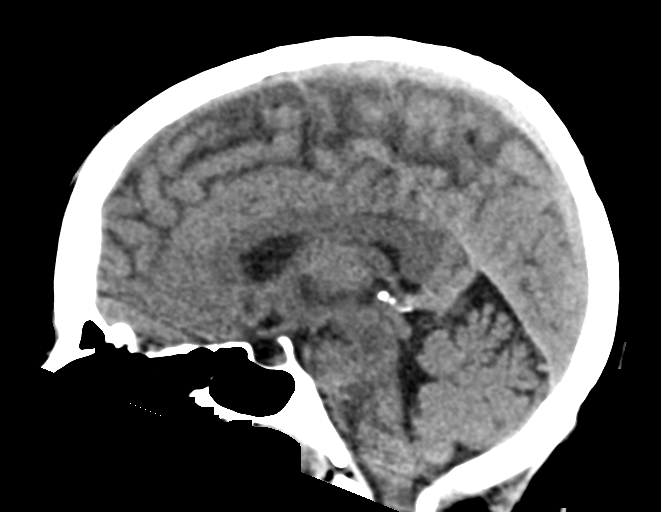
[im 39/59  brain]
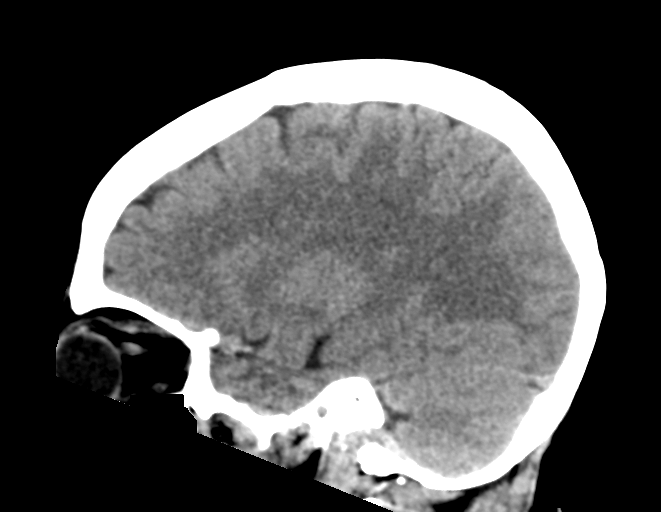

[15 of 46 positions shown; findings below may reference images not displayed]

FINDINGS: Brain: There is no evidence for acute hemorrhage, hydrocephalus,
mass lesion, or abnormal extra-axial fluid collection. No definite
CT evidence for acute infarction.

Vascular: No hyperdense vessel or unexpected calcification.

Skull: No evidence for fracture. No worrisome lytic or sclerotic
lesion.

Sinuses/Orbits: The visualized paranasal sinuses and mastoid air
cells are clear. Visualized portions of the globes and intraorbital
fat are unremarkable.

Other: None.
IMPRESSION: No acute intracranial abnormality.

## 2023-05-22 ENCOUNTER — Encounter: Payer: Medicaid Other | Admitting: Adult Health

## 2023-05-23 NOTE — Progress Notes (Signed)
This encounter was created in error - please disregard.

## 2023-06-05 ENCOUNTER — Encounter: Payer: Medicaid Other | Admitting: Adult Health

## 2023-06-09 NOTE — Progress Notes (Signed)
This encounter was created in error - please disregard.

## 2023-06-20 ENCOUNTER — Encounter: Payer: Medicaid Other | Admitting: Adult Health

## 2023-06-20 NOTE — Progress Notes (Signed)
This encounter was created in error - please disregard.
